# Patient Record
Sex: Female | Born: 1975 | Race: Black or African American | Hispanic: No | Marital: Single | Smoking: Current some day smoker
Health system: Southern US, Community
[De-identification: ages and names within clinical notes are randomized; demographics above are authoritative.]

## PROBLEM LIST (undated history)

## (undated) DIAGNOSIS — I10 Essential (primary) hypertension: Secondary | ICD-10-CM

## (undated) HISTORY — PX: HERNIA REPAIR: SHX51

---

## 2014-11-29 ENCOUNTER — Emergency Department
Admission: EM | Admit: 2014-11-29 | Discharge: 2014-11-29 | Disposition: A | Discharge status: Home / Self Care | Payer: Self-pay | Attending: Emergency Medicine | Admitting: Emergency Medicine

## 2014-11-29 ENCOUNTER — Other Ambulatory Visit: Payer: Self-pay

## 2014-11-29 DIAGNOSIS — R51 Headache: Secondary | ICD-10-CM | POA: Insufficient documentation

## 2014-11-29 DIAGNOSIS — R079 Chest pain, unspecified: Secondary | ICD-10-CM | POA: Insufficient documentation

## 2014-11-29 DIAGNOSIS — R519 Headache, unspecified: Secondary | ICD-10-CM

## 2014-11-29 DIAGNOSIS — Z720 Tobacco use: Secondary | ICD-10-CM | POA: Insufficient documentation

## 2014-11-29 DIAGNOSIS — R110 Nausea: Secondary | ICD-10-CM | POA: Insufficient documentation

## 2014-11-29 LAB — BASIC METABOLIC PANEL
Anion gap: 7 (ref 5–15)
BUN: 9 mg/dL (ref 6–20)
CO2: 25 mmol/L (ref 22–32)
CREATININE: 0.93 mg/dL (ref 0.44–1.00)
Calcium: 8.5 mg/dL — ABNORMAL LOW (ref 8.9–10.3)
Chloride: 104 mmol/L (ref 101–111)
GFR calc non Af Amer: >60 mL/min (ref >60)
Glucose, Bld: 113 mg/dL — ABNORMAL HIGH (ref 65–99)
Potassium: 3.2 mmol/L — ABNORMAL LOW (ref 3.5–5.1)
SODIUM: 136 mmol/L (ref 135–145)

## 2014-11-29 LAB — CBC
HCT: 38.5 % (ref 35.0–47.0)
Hemoglobin: 12.9 g/dL (ref 12.0–16.0)
MCH: 29.6 pg (ref 26.0–34.0)
MCHC: 33.5 g/dL (ref 32.0–36.0)
MCV: 88.4 fL (ref 80.0–100.0)
Platelets: 237 10*3/uL (ref 150–440)
RBC: 4.35 MIL/uL (ref 3.80–5.20)
RDW: 12.9 % (ref 11.5–14.5)
WBC: 8 10*3/uL (ref 3.6–11.0)

## 2014-11-29 LAB — TROPONIN I: Troponin I: <0.03 ng/mL (ref <0.031)

## 2014-11-29 MED ORDER — ZOLPIDEM TARTRATE 10 MG PO TABS: 10.0000 mg | ORAL_TABLET | Freq: Every evening | ORAL | Status: AC | PRN

## 2014-11-29 MED ORDER — SODIUM CHLORIDE 0.9 % IV SOLN
Freq: Once | INTRAVENOUS | Status: AC
  Administered 2014-11-29: 13:00:00 via INTRAVENOUS

## 2014-11-29 MED ORDER — KETOROLAC TROMETHAMINE 30 MG/ML IJ SOLN
INTRAMUSCULAR | Status: AC
  Administered 2014-11-29: 30 mg via INTRAVENOUS
  Filled 2014-11-29: qty 1

## 2014-11-29 MED ORDER — DIPHENHYDRAMINE HCL 50 MG/ML IJ SOLN
INTRAMUSCULAR | Status: AC
  Administered 2014-11-29: 25 mg via INTRAVENOUS
  Filled 2014-11-29: qty 1

## 2014-11-29 MED ORDER — BUTALBITAL-APAP-CAFFEINE 50-325-40 MG PO TABS: 1.0000 | ORAL_TABLET | Freq: Four times a day (QID) | ORAL | Status: AC | PRN

## 2014-11-29 MED ORDER — METOCLOPRAMIDE HCL 5 MG/ML IJ SOLN
10.0000 mg | Freq: Once | INTRAMUSCULAR | Status: AC
  Administered 2014-11-29: 10 mg via INTRAVENOUS

## 2014-11-29 MED ORDER — METOCLOPRAMIDE HCL 5 MG/ML IJ SOLN
INTRAMUSCULAR | Status: AC
  Administered 2014-11-29: 10 mg via INTRAVENOUS
  Filled 2014-11-29: qty 2

## 2014-11-29 MED ORDER — KETOROLAC TROMETHAMINE 30 MG/ML IJ SOLN
30.0000 mg | Freq: Once | INTRAMUSCULAR | Status: AC
  Administered 2014-11-29: 30 mg via INTRAVENOUS

## 2014-11-29 MED ORDER — DIPHENHYDRAMINE HCL 50 MG/ML IJ SOLN
25.0000 mg | Freq: Once | INTRAMUSCULAR | Status: AC
  Administered 2014-11-29: 25 mg via INTRAVENOUS

## 2014-11-29 NOTE — ED Notes (Signed)
POCT urine preg- Negative.  

## 2014-11-29 NOTE — ED Notes (Signed)
"  Migrane" X 2 days, nauseated, intermittent chest "tightness". Pain behind left eye. Denies weakness, dizziness, or AMS. Pt alert and oriented X4, active, cooperative, pt in NAD. RR even and unlabored, color WNL.

## 2014-11-29 NOTE — ED Provider Notes (Signed)
[facility] Emergency Department Provider Note     Time seen: ----------------------------------------- 12:21 PM on 11/29/2014 -----------------------------------------    I have reviewed the triage vital signs and the nursing notes.   HISTORY  Chief Complaint Headache; Chest Pain; and Nausea    HPI Yvonne Daniels is a 39 y.o. female since ER for migraine for the last 2 days. She has not had a history of migraines, this is what she refers to these headaches as. Does not have a history of significant headaches in the past. She notes she is not slept well for the last 2 weeks, has pain in the left frontal scalp area, light and sound bothers her. She is nauseous, was any vomiting. Denies any numbness weakness or focal neurologic complaint. Pain is severe this time.    History reviewed. No pertinent past medical history.  There are no active problems to display for this patient.   Past Surgical History  Procedure Laterality Date  . Hernia repair      No current outpatient prescriptions on file.  Allergies Review of patient's allergies indicates no known allergies.  No family history on file.  Social History History  Substance Use Topics  . Smoking status: Current Some Day Smoker  . Smokeless tobacco: Not on file  . Alcohol Use: No    Review of Systems Constitutional: Negative for fever. Eyes: Negative for visual changes. ENT: Negative for sore throat. Cardiovascular: Negative for chest pain. Respiratory: Negative for shortness of breath. Gastrointestinal: Negative for abdominal pain, positive for nausea Genitourinary: Negative for dysuria. Musculoskeletal: Negative for back pain. Skin: Negative for rash. Neurological: Positive for headaches, negative for focal weakness or numbness.  10-point ROS otherwise negative.  ____________________________________________   PHYSICAL EXAM:  VITAL SIGNS: ED Triage Vitals  Enc Vitals  Group     BP 11/29/14 1100 189/111 mmHg     Pulse Rate 11/29/14 1100 76     Resp 11/29/14 1100 18     Temp 11/29/14 1100 97.8 F (36.6 C)     Temp Source 11/29/14 1100 Oral     SpO2 11/29/14 1100 100 %     Weight 11/29/14 1100 240 lb (108.863 kg)     Height 11/29/14 1100  (1.702 m)     Head Cir --      Peak Flow --      Pain Score 11/29/14 1101 10     Pain Loc --      Pain Edu? --      Excl. in GC? --     Constitutional: Alert and oriented. Well appearing and in no distress. Eyes: Conjunctivae are normal. Mild photophobia Normal extraocular movements. ENT   Head: Normocephalic and atraumatic.   Nose: No congestion/rhinnorhea.   Mouth/Throat: Mucous membranes are moist.   Neck: No stridor. Hematological/Lymphatic/Immunilogical: No cervical lymphadenopathy. Cardiovascular: Normal rate, regular rhythm. Normal and symmetric distal pulses are present in all extremities. No murmurs, rubs, or gallops. Respiratory: Normal respiratory effort without tachypnea nor retractions. Breath sounds are clear and equal bilaterally. No wheezes/rales/rhonchi. Gastrointestinal: Soft and nontender. No distention. No abdominal bruits. There is no CVA tenderness. Musculoskeletal: Nontender with normal range of motion in all extremities. No joint effusions.  No lower extremity tenderness nor edema. Neurologic:  Normal speech and language. No gross focal neurologic deficits are appreciated. Speech is normal. No gait instability. Skin:  Skin is warm, dry and intact. No rash noted. Psychiatric: Mood and affect are normal. Speech and behavior are normal. Patient  exhibits appropriate insight and judgment.  ____________________________________________    LABS (pertinent positives/negatives)  Labs Reviewed  BASIC METABOLIC PANEL - Abnormal; Notable for the following:    Potassium 3.2 (*)    Glucose, Bld 113 (*)    Calcium 8.5 (*)    All other components within normal limits  CBC   TROPONIN I  POC URINE PREG, ED      ____________________________________________  ED COURSE:  Pertinent labs & imaging results that were available during my care of the patient were reviewed by me and considered in my medical decision making (see chart for details).  Patient received IV fluid as well as IV Reglan, Toradol and Benadryl. ____________________________________________   RADIOLOGY  None  ____________________________________________    FINAL ASSESSMENT AND PLAN  Headache  Plan: Patient headaches likely due to sleep deprivation. Patient on better after saline, Reglan Toradol and Benadryl. We'll prescribe a sleep aid. She is stable for outpatient follow-up, no indication for CT scan or further workup at this time.    Emily FilbertWilliams, Jonathan E, MD   Emily FilbertJonathan E Williams, MD 11/29/14 [phone]

## 2014-11-29 NOTE — Discharge Instructions (Signed)
General Headache Without Cause  A general headache is pain or discomfort felt around the head or neck area. The cause may not be found.   HOME CARE   · Keep all doctor visits.  · Only take medicines as told by your doctor.  · Lie down in a dark, quiet room when you have a headache.  · Keep a journal to find out if certain things bring on headaches. For example, write down:  ¨ What you eat and drink.  ¨ How much sleep you get.  ¨ Any change to your diet or medicines.  · Relax by getting a massage or doing other relaxing activities.  · Put ice or heat packs on the head and neck area as told by your doctor.  · Lessen stress.  · Sit up straight. Do not tighten (tense) your muscles.  · Quit smoking if you smoke.  · Lessen how much alcohol you drink.  · Lessen how much caffeine you drink, or stop drinking caffeine.  · Eat and sleep on a regular schedule.  · Get 7 to 9 hours of sleep, or as told by your doctor.  · Keep lights dim if bright lights bother you or make your headaches worse.  GET HELP RIGHT AWAY IF:   · Your headache becomes really bad.  · You have a fever.  · You have a stiff neck.  · You have trouble seeing.  · Your muscles are weak, or you lose muscle control.  · You lose your balance or have trouble walking.  · You feel like you will pass out (faint), or you pass out.  · You have really bad symptoms that are different than your first symptoms.  · You have problems with the medicines given to you by your doctor.  · Your medicines do not work.  · Your headache feels different than the other headaches.  · You feel sick to your stomach (nauseous) or throw up (vomit).  MAKE SURE YOU:   · Understand these instructions.  · Will watch your condition.  · Will get help right away if you are not doing well or get worse.  Document Released: 04/01/2008 Document Revised: 09/15/2011 Document Reviewed: 06/13/2011  ExitCare® Patient Information ©2015 [employer]. This information is not intended to replace advice given to  you by your health care provider. Make sure you discuss any questions you have with your health care provider.

## 2014-11-30 DIAGNOSIS — I10 Essential (primary) hypertension: Secondary | ICD-10-CM | POA: Insufficient documentation

## 2014-11-30 DIAGNOSIS — Z79899 Other long term (current) drug therapy: Secondary | ICD-10-CM | POA: Insufficient documentation

## 2014-11-30 DIAGNOSIS — Z720 Tobacco use: Secondary | ICD-10-CM | POA: Insufficient documentation

## 2014-11-30 DIAGNOSIS — R51 Headache: Secondary | ICD-10-CM | POA: Insufficient documentation

## 2014-11-30 LAB — BASIC METABOLIC PANEL
Anion gap: 7 (ref 5–15)
BUN: 9 mg/dL (ref 6–20)
CO2: 25 mmol/L (ref 22–32)
Calcium: 8.7 mg/dL — ABNORMAL LOW (ref 8.9–10.3)
Chloride: 107 mmol/L (ref 101–111)
Creatinine, Ser: 1.06 mg/dL — ABNORMAL HIGH (ref 0.44–1.00)
GFR calc non Af Amer: >60 mL/min (ref >60)
GLUCOSE: 100 mg/dL — AB (ref 65–99)
Potassium: 3.2 mmol/L — ABNORMAL LOW (ref 3.5–5.1)
Sodium: 139 mmol/L (ref 135–145)

## 2014-11-30 LAB — CBC
HCT: 37.3 % (ref 35.0–47.0)
Hemoglobin: 12.4 g/dL (ref 12.0–16.0)
MCH: 29.4 pg (ref 26.0–34.0)
MCHC: 33.2 g/dL (ref 32.0–36.0)
MCV: 88.6 fL (ref 80.0–100.0)
PLATELETS: 242 10*3/uL (ref 150–440)
RBC: 4.21 MIL/uL (ref 3.80–5.20)
RDW: 12.8 % (ref 11.5–14.5)
WBC: 7.9 10*3/uL (ref 3.6–11.0)

## 2014-11-30 LAB — TROPONIN I

## 2014-11-30 NOTE — ED Notes (Signed)
Pt in with co htn for about a month, seen here yest for migraine and released.  Today still having migraine but now concerned about bp, never has been dx with htn.

## 2014-12-01 ENCOUNTER — Emergency Department
Admission: EM | Admit: 2014-12-01 | Discharge: 2014-12-01 | Disposition: A | Discharge status: Home / Self Care | Payer: Self-pay | Attending: Emergency Medicine | Admitting: Emergency Medicine

## 2014-12-01 DIAGNOSIS — I10 Essential (primary) hypertension: Secondary | ICD-10-CM

## 2014-12-01 MED ORDER — HYDROCHLOROTHIAZIDE 25 MG PO TABS: 25.0000 mg | ORAL_TABLET | Freq: Every day | ORAL | Status: DC

## 2014-12-01 NOTE — Discharge Instructions (Signed)
Hypertension Hypertension, commonly called high blood pressure, is when the force of blood pumping through your arteries is too strong. Your arteries are the blood vessels that carry blood from your heart throughout your body. A blood pressure reading consists of a higher number over a lower number, such as 110/72. The higher number (systolic) is the pressure inside your arteries when your heart pumps. The lower number (diastolic) is the pressure inside your arteries when your heart relaxes. Ideally you want your blood pressure below 120/80. Hypertension forces your heart to work harder to pump blood. Your arteries may become narrow or stiff. Having hypertension puts you at risk for heart disease, stroke, and other problems.  RISK FACTORS Some risk factors for high blood pressure are controllable. Others are not.  Risk factors you cannot control include:   Race. You may be at higher risk if you are African American.  Age. Risk increases with age.  Gender. Men are at higher risk than women before age 45 years. After age 65, women are at higher risk than men. Risk factors you can control include:  Not getting enough exercise or physical activity.  Being overweight.  Getting too much fat, sugar, calories, or salt in your diet.  Drinking too much alcohol. SIGNS AND SYMPTOMS Hypertension does not usually cause signs or symptoms. Extremely high blood pressure (hypertensive crisis) may cause headache, anxiety, shortness of breath, and nosebleed. DIAGNOSIS  To check if you have hypertension, your health care provider will measure your blood pressure while you are seated, with your arm held at the level of your heart. It should be measured at least twice using the same arm. Certain conditions can cause a difference in blood pressure between your right and left arms. A blood pressure reading that is higher than normal on one occasion does not mean that you need treatment. If one blood pressure reading  is high, ask your health care provider about having it checked again. TREATMENT  Treating high blood pressure includes making lifestyle changes and possibly taking medicine. Living a healthy lifestyle can help lower high blood pressure. You may need to change some of your habits. Lifestyle changes may include:  Following the DASH diet. This diet is high in fruits, vegetables, and whole grains. It is low in salt, red meat, and added sugars.  Getting at least 2 hours of brisk physical activity every week.  Losing weight if necessary.  Not smoking.  Limiting alcoholic beverages.  Learning ways to reduce stress. If lifestyle changes are not enough to get your blood pressure under control, your health care provider may prescribe medicine. You may need to take more than one. Work closely with your health care provider to understand the risks and benefits. HOME CARE INSTRUCTIONS  Have your blood pressure rechecked as directed by your health care provider.   Take medicines only as directed by your health care provider. Follow the directions carefully. Blood pressure medicines must be taken as prescribed. The medicine does not work as well when you skip doses. Skipping doses also puts you at risk for problems.   Do not smoke.   Monitor your blood pressure at home as directed by your health care provider. SEEK MEDICAL CARE IF:   You think you are having a reaction to medicines taken.  You have recurrent headaches or feel dizzy.  You have swelling in your ankles.  You have trouble with your vision. SEEK IMMEDIATE MEDICAL CARE IF:  You develop a severe headache or confusion.    You have unusual weakness, numbness, or feel faint.  You have severe chest or abdominal pain.  You vomit repeatedly.  You have trouble breathing. MAKE SURE YOU:   Understand these instructions.  Will watch your condition.  Will get help right away if you are not doing well or get worse. Document  Released: 06/23/2005 Document Revised: 11/07/2013 Document Reviewed: 04/15/2013 ExitCare Patient Information 2015 [employer]. This information is not intended to replace advice given to you by your health care provider. Make sure you discuss any questions you have with your health care provider. DASH Eating Plan DASH stands for "Dietary Approaches to Stop Hypertension." The DASH eating plan is a healthy eating plan that has been shown to reduce high blood pressure (hypertension). Additional health benefits may include reducing the risk of type 2 diabetes mellitus, heart disease, and stroke. The DASH eating plan may also help with weight loss. WHAT DO I NEED TO KNOW ABOUT THE DASH EATING PLAN? For the DASH eating plan, you will follow these general guidelines:  Choose foods with a percent daily value for sodium of less than 5% (as listed on the food label).  Use salt-free seasonings or herbs instead of table salt or sea salt.  Check with your health care provider or pharmacist before using salt substitutes.  Eat lower-sodium products, often labeled as "lower sodium" or "no salt added."  Eat fresh foods.  Eat more vegetables, fruits, and low-fat dairy products.  Choose whole grains. Look for the word "whole" as the first word in the ingredient list.  Choose fish and skinless chicken or turkey more often than red meat. Limit fish, poultry, and meat to 6 oz (170 g) each day.  Limit sweets, desserts, sugars, and sugary drinks.  Choose heart-healthy fats.  Limit cheese to 1 oz (28 g) per day.  Eat more home-cooked food and less restaurant, buffet, and fast food.  Limit fried foods.  Cook foods using methods other than frying.  Limit canned vegetables. If you do use them, rinse them well to decrease the sodium.  When eating at a restaurant, ask that your food be prepared with less salt, or no salt if possible. WHAT FOODS CAN I EAT? Seek help from a dietitian for individual  calorie needs. Grains Whole grain or whole wheat bread. Brown rice. Whole grain or whole wheat pasta. Quinoa, bulgur, and whole grain cereals. Low-sodium cereals. Corn or whole wheat flour tortillas. Whole grain cornbread. Whole grain crackers. Low-sodium crackers. Vegetables Fresh or frozen vegetables (raw, steamed, roasted, or grilled). Low-sodium or reduced-sodium tomato and vegetable juices. Low-sodium or reduced-sodium tomato sauce and paste. Low-sodium or reduced-sodium canned vegetables.  Fruits All fresh, canned (in natural juice), or frozen fruits. Meat and Other Protein Products Ground beef (85% or leaner), grass-fed beef, or beef trimmed of fat. Skinless chicken or turkey. Ground chicken or turkey. Pork trimmed of fat. All fish and seafood. Eggs. Dried beans, peas, or lentils. Unsalted nuts and seeds. Unsalted canned beans. Dairy Low-fat dairy products, such as skim or 1% milk, 2% or reduced-fat cheeses, low-fat ricotta or cottage cheese, or plain low-fat yogurt. Low-sodium or reduced-sodium cheeses. Fats and Oils Tub margarines without trans fats. Light or reduced-fat mayonnaise and salad dressings (reduced sodium). Avocado. Safflower, olive, or canola oils. Natural peanut or almond butter. Other Unsalted popcorn and pretzels. The items listed above may not be a complete list of recommended foods or beverages. Contact your dietitian for more options. WHAT FOODS ARE NOT RECOMMENDED? Grains White bread.   White pasta. White rice. Refined cornbread. Bagels and croissants. Crackers that contain trans fat. Vegetables Creamed or fried vegetables. Vegetables in a cheese sauce. Regular canned vegetables. Regular canned tomato sauce and paste. Regular tomato and vegetable juices. Fruits Dried fruits. Canned fruit in light or heavy syrup. Fruit juice. Meat and Other Protein Products Fatty cuts of meat. Ribs, chicken wings, bacon, sausage, bologna, salami, chitterlings, fatback, hot dogs,  bratwurst, and packaged luncheon meats. Salted nuts and seeds. Canned beans with salt. Dairy Whole or 2% milk, cream, half-and-half, and cream cheese. Whole-fat or sweetened yogurt. Full-fat cheeses or blue cheese. Nondairy creamers and whipped toppings. Processed cheese, cheese spreads, or cheese curds. Condiments Onion and garlic salt, seasoned salt, table salt, and sea salt. Canned and packaged gravies. Worcestershire sauce. Tartar sauce. Barbecue sauce. Teriyaki sauce. Soy sauce, including reduced sodium. Steak sauce. Fish sauce. Oyster sauce. Cocktail sauce. Horseradish. Ketchup and mustard. Meat flavorings and tenderizers. Bouillon cubes. Hot sauce. Tabasco sauce. Marinades. Taco seasonings. Relishes. Fats and Oils Butter, stick margarine, lard, shortening, ghee, and bacon fat. Coconut, palm kernel, or palm oils. Regular salad dressings. Other Pickles and olives. Salted popcorn and pretzels. The items listed above may not be a complete list of foods and beverages to avoid. Contact your dietitian for more information. WHERE CAN I FIND MORE INFORMATION? National Heart, Lung, and Blood Institute: www.nhlbi.nih.gov/health/health-topics/topics/dash/ Document Released: 06/12/2011 Document Revised: 11/07/2013 Document Reviewed: 04/27/2013 ExitCare Patient Information 2015 [employer]. This information is not intended to replace advice given to you by your health care provider. Make sure you discuss any questions you have with your health care provider.  

## 2014-12-01 NOTE — ED Provider Notes (Signed)
[facility] Emergency Department Provider Note  ____________________________________________  Time seen: Approximately 12:31 AM  I have reviewed the triage vital signs and the nursing notes.   HISTORY  Chief Complaint Hypertension    HPI Yvonne Daniels is a 39 y.o. female who presents with a chief complaint of hypertension. 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 Patient was seen in the ED yesterday for migraine; hypertension was noted on the visit yesterday. Today patient says headache is much improved. Denies blurred vision, chest pain, shortness of breath, vomiting, diarrhea, weakness, numbness, tingling.  Past medical history None   Past Surgical History  Procedure Laterality Date  . Hernia repair      Current Outpatient Rx  Name  Route  Sig  Dispense  Refill  . butalbital-acetaminophen-caffeine (FIORICET) 50-325-40 MG per tablet   Oral   Take 1-2 tablets by mouth every 6 (six) hours as needed for headache.   20 tablet   0   . zolpidem (AMBIEN) 10 MG tablet   Oral   Take 1 tablet (10 mg total) by mouth at bedtime as needed for sleep.   30 tablet   0   . hydrochlorothiazide (HYDRODIURIL) 25 MG tablet   Oral   Take 1 tablet (25 mg total) by mouth daily.   30 tablet   0     Allergies Review of patient's allergies indicates no known allergies.  Family history Hypertension on both sides of her family.  Social History History  Substance Use Topics  . Smoking status: Current Some Day Smoker  . Smokeless tobacco: Not on file  . Alcohol Use: No    Review of Systems Constitutional: No fever/chills Eyes: No visual changes. ENT: No sore throat. Cardiovascular: Denies chest pain. Respiratory: Denies shortness of breath. Gastrointestinal: No abdominal pain.  No nausea, no vomiting.  No diarrhea.  No constipation. Genitourinary: Negative for  dysuria. Musculoskeletal: Negative for back pain. Skin: Negative for rash. Neurological: Positive for headaches. Negative for focal weakness or numbness.  10-point ROS otherwise negative.  ____________________________________________   PHYSICAL EXAM:  VITAL SIGNS: ED Triage Vitals  Enc Vitals Group     BP 11/30/14 1944 184/117 mmHg     Pulse Rate 11/30/14 1944 74     Resp 11/30/14 1944 18     Temp 11/30/14 1944 98.2 F (36.8 C)     Temp Source 11/30/14 1944 Oral     SpO2 11/30/14 1944 100 %     Weight 11/30/14 1944 240 lb (108.863 kg)     Height 11/30/14 1944 5\' 7"  (1.702 m)     Head Cir --      Peak Flow --      Pain Score 11/30/14 1944 4     Pain Loc --      Pain Edu? --      Excl. in GC? --     Constitutional: Alert and oriented. Well appearing and in no acute distress. Eyes: Conjunctivae are normal. PERRL. EOMI. Head: Atraumatic. Nose: No congestion/rhinnorhea. Mouth/Throat: Mucous membranes are moist.  Oropharynx non-erythematous. Neck: No stridor. No carotid bruits. Cardiovascular: Normal rate, regular rhythm. Grossly normal heart sounds.  Good peripheral circulation. Respiratory: Normal respiratory effort.  No retractions. Lungs CTAB. Gastrointestinal: Soft and nontender. No distention. No abdominal bruits. No CVA tenderness. Musculoskeletal: No lower extremity tenderness nor edema.  No joint effusions. Neurologic:  Normal speech and language. No gross focal  neurologic deficits are appreciated. Speech is normal. No gait instability. Skin:  Skin is warm, dry and intact. No rash noted. Psychiatric: Mood and affect are normal. Speech and behavior are normal.  ____________________________________________   LABS (all labs ordered are listed, but only abnormal results are displayed)  Labs Reviewed  BASIC METABOLIC PANEL - Abnormal; Notable for the following:    Potassium 3.2 (*)    Glucose, Bld 100 (*)    Creatinine, Ser 1.06 (*)    Calcium 8.7 (*)    All  other components within normal limits  CBC  TROPONIN I   ____________________________________________  EKG  ED ECG REPORT I, Johanny Segers J, the attending physician, personally viewed and interpreted this ECG.   Date: 12/01/2014  EKG Time: 2006  Rate: 61  Rhythm: normal EKG, normal sinus rhythm  Axis: Normal  Intervals:none  ST&T Change: Nonspecific  ____________________________________________  RADIOLOGY  None ____________________________________________   PROCEDURES  Procedure(s) performed: None  Critical Care performed: No  ____________________________________________   INITIAL IMPRESSION / ASSESSMENT AND PLAN / ED COURSE  Pertinent labs & imaging results that were available during my care of the patient were reviewed by me and considered in my medical decision making (see chart for details).  39 year old African-American female with hypertension; significant family history of hypertension as well. No evidence of end organ damage on labs or EKG. Discussed with patient-will start patient on HCTZ 25 mg daily and establish PCP for follow-up care. Strict return precautions given. Patient and family verbalized understanding and agrees with plan of care. ____________________________________________   FINAL CLINICAL IMPRESSION(S) / ED DIAGNOSES  Final diagnoses:  Essential hypertension      Irean Hong, MD 12/01/14 [phone]

## 2015-07-04 ENCOUNTER — Emergency Department
Admission: EM | Admit: 2015-07-04 | Discharge: 2015-07-04 | Disposition: A | Discharge status: Home / Self Care | Payer: Self-pay | Attending: Emergency Medicine | Admitting: Emergency Medicine

## 2015-07-04 ENCOUNTER — Encounter: Payer: Self-pay | Admitting: *Deleted

## 2015-07-04 ENCOUNTER — Emergency Department: Payer: Self-pay

## 2015-07-04 ENCOUNTER — Other Ambulatory Visit: Payer: Self-pay

## 2015-07-04 DIAGNOSIS — J069 Acute upper respiratory infection, unspecified: Secondary | ICD-10-CM | POA: Insufficient documentation

## 2015-07-04 DIAGNOSIS — R197 Diarrhea, unspecified: Secondary | ICD-10-CM | POA: Insufficient documentation

## 2015-07-04 DIAGNOSIS — Z79899 Other long term (current) drug therapy: Secondary | ICD-10-CM | POA: Insufficient documentation

## 2015-07-04 DIAGNOSIS — R112 Nausea with vomiting, unspecified: Secondary | ICD-10-CM | POA: Insufficient documentation

## 2015-07-04 DIAGNOSIS — F17200 Nicotine dependence, unspecified, uncomplicated: Secondary | ICD-10-CM | POA: Insufficient documentation

## 2015-07-04 DIAGNOSIS — R109 Unspecified abdominal pain: Secondary | ICD-10-CM | POA: Insufficient documentation

## 2015-07-04 LAB — URINALYSIS COMPLETE WITH MICROSCOPIC (ARMC ONLY)
BILIRUBIN URINE: NEGATIVE
Glucose, UA: NEGATIVE mg/dL
KETONES UR: NEGATIVE mg/dL
Leukocytes, UA: NEGATIVE
NITRITE: NEGATIVE
PH: 5 (ref 5.0–8.0)
PROTEIN: NEGATIVE mg/dL
Specific Gravity, Urine: 1.023 (ref 1.005–1.030)

## 2015-07-04 LAB — COMPREHENSIVE METABOLIC PANEL
ALK PHOS: 46 U/L (ref 38–126)
ALT: 15 U/L (ref 14–54)
AST: 9 U/L — ABNORMAL LOW (ref 15–41)
Albumin: 3.6 g/dL (ref 3.5–5.0)
Anion gap: 5 (ref 5–15)
BUN: 11 mg/dL (ref 6–20)
CALCIUM: 8.6 mg/dL — AB (ref 8.9–10.3)
CO2: 25 mmol/L (ref 22–32)
Chloride: 108 mmol/L (ref 101–111)
Creatinine, Ser: 0.95 mg/dL (ref 0.44–1.00)
GFR calc non Af Amer: >60 mL/min (ref >60)
Glucose, Bld: 85 mg/dL (ref 65–99)
Potassium: 4.2 mmol/L (ref 3.5–5.1)
Sodium: 138 mmol/L (ref 135–145)
Total Bilirubin: 0.5 mg/dL (ref 0.3–1.2)
Total Protein: 6.9 g/dL (ref 6.5–8.1)

## 2015-07-04 LAB — CBC
HCT: 36.6 % (ref 35.0–47.0)
HEMOGLOBIN: 12 g/dL (ref 12.0–16.0)
MCH: 28.9 pg (ref 26.0–34.0)
MCHC: 32.7 g/dL (ref 32.0–36.0)
MCV: 88.3 fL (ref 80.0–100.0)
Platelets: 245 10*3/uL (ref 150–440)
RBC: 4.14 MIL/uL (ref 3.80–5.20)
RDW: 13.2 % (ref 11.5–14.5)
WBC: 12.7 10*3/uL — ABNORMAL HIGH (ref 3.6–11.0)

## 2015-07-04 MED ORDER — TRAMADOL HCL 50 MG PO TABS: 50.0000 mg | ORAL_TABLET | Freq: Four times a day (QID) | ORAL | Status: AC | PRN

## 2015-07-04 MED ORDER — ONDANSETRON HCL 4 MG/2ML IJ SOLN
4.0000 mg | Freq: Once | INTRAMUSCULAR | Status: AC
  Administered 2015-07-04: 4 mg via INTRAVENOUS
  Filled 2015-07-04: qty 2

## 2015-07-04 MED ORDER — MORPHINE SULFATE (PF) 4 MG/ML IV SOLN
4.0000 mg | Freq: Once | INTRAVENOUS | Status: AC
  Administered 2015-07-04: 4 mg via INTRAVENOUS
  Filled 2015-07-04: qty 1

## 2015-07-04 MED ORDER — SODIUM CHLORIDE 0.9 % IV BOLUS (SEPSIS)
1000.0000 mL | Freq: Once | INTRAVENOUS | Status: AC
  Administered 2015-07-04: 1000 mL via INTRAVENOUS

## 2015-07-04 MED ORDER — ONDANSETRON 4 MG PO TBDP: 4.0000 mg | ORAL_TABLET | Freq: Three times a day (TID) | ORAL | Status: DC | PRN

## 2015-07-04 NOTE — Discharge Instructions (Signed)
Nausea and Vomiting °Nausea means you feel sick to your stomach. Throwing up (vomiting) is a reflex where stomach contents come out of your mouth. °HOME CARE  °· Take medicine as told by your doctor. °· Do not force yourself to eat. However, you do need to drink fluids. °· If you feel like eating, eat a normal diet as told by your doctor. °· Eat rice, wheat, potatoes, bread, lean meats, yogurt, fruits, and vegetables. °· Avoid high-fat foods. °· Drink enough fluids to keep your pee (urine) clear or pale yellow. °· Ask your doctor how to replace body fluid losses (rehydrate). Signs of body fluid loss (dehydration) include: °· Feeling very thirsty. °· Dry lips and mouth. °· Feeling dizzy. °· Dark pee. °· Peeing less than normal. °· Feeling confused. °· Fast breathing or heart rate. °GET HELP RIGHT AWAY IF:  °· You have blood in your throw up. °· You have black or bloody poop (stool). °· You have a bad headache or stiff neck. °· You feel confused. °· You have bad belly (abdominal) pain. °· You have chest pain or trouble breathing. °· You do not pee at least once every 8 hours. °· You have cold, clammy skin. °· You keep throwing up after 24 to 48 hours. °· You have a fever. °MAKE SURE YOU:  °· Understand these instructions. °· Will watch your condition. °· Will get help right away if you are not doing well or get worse. °  °This information is not intended to replace advice given to you by your health care provider. Make sure you discuss any questions you have with your health care provider. °  °Document Released: 12/10/2007 Document Revised: 09/15/2011 Document Reviewed: 11/22/2010 °Elsevier Interactive Patient Education ©2016 [employer] ° °Upper Respiratory Infection, Adult °Most upper respiratory infections (URIs) are a viral infection of the air passages leading to the lungs. A URI affects the nose, throat, and upper air passages. The most common type of URI is nasopharyngitis and is typically referred to as "the  common cold." °URIs run their course and usually go away on their own. Most of the time, a URI does not require medical attention, but sometimes a bacterial infection in the upper airways can follow a viral infection. This is called a secondary infection. Sinus and middle ear infections are common types of secondary upper respiratory infections. °Bacterial pneumonia can also complicate a URI. A URI can worsen asthma and chronic obstructive pulmonary disease (COPD). Sometimes, these complications can require emergency medical care and may be life threatening.  °CAUSES °Almost all URIs are caused by viruses. A virus is a type of germ and can spread from one person to another.  °RISKS FACTORS °You may be at risk for a URI if:  °· You smoke.   °· You have chronic heart or lung disease. °· You have a weakened defense (immune) system.   °· You are very young or very old.   °· You have nasal allergies or asthma. °· You work in crowded or poorly ventilated areas. °· You work in health care facilities or schools. °SIGNS AND SYMPTOMS  °Symptoms typically develop 2-3 days after you come in contact with a cold virus. Most viral URIs last 7-10 days. However, viral URIs from the influenza virus (flu virus) can last 14-18 days and are typically more severe. Symptoms may include:  °· Runny or stuffy (congested) nose.   °· Sneezing.   °· Cough.   °· Sore throat.   °· Headache.   °· Fatigue.   °· Fever.   °·   Loss of appetite.   °· Pain in your forehead, behind your eyes, and over your cheekbones (sinus pain). °· Muscle aches.   °DIAGNOSIS  °Your health care provider may diagnose a URI by: °· Physical exam. °· Tests to check that your symptoms are not due to another condition such as: °¨ Strep throat. °¨ Sinusitis. °¨ Pneumonia. °¨ Asthma. °TREATMENT  °A URI goes away on its own with time. It cannot be cured with medicines, but medicines may be prescribed or recommended to relieve symptoms. Medicines may help: °· Reduce your  fever. °· Reduce your cough. °· Relieve nasal congestion. °HOME CARE INSTRUCTIONS  °· Take medicines only as directed by your health care provider.   °· Gargle warm saltwater or take cough drops to comfort your throat as directed by your health care provider. °· Use a warm mist humidifier or inhale steam from a shower to increase air moisture. This may make it easier to breathe. °· Drink enough fluid to keep your urine clear or pale yellow.   °· Eat soups and other clear broths and maintain good nutrition.   °· Rest as needed.   °· Return to work when your temperature has returned to normal or as your health care provider advises. You may need to stay home longer to avoid infecting others. You can also use a face mask and careful hand washing to prevent spread of the virus. °· Increase the usage of your inhaler if you have asthma.   °· Do not use any tobacco products, including cigarettes, chewing tobacco, or electronic cigarettes. If you need help quitting, ask your health care provider. °PREVENTION  °The best way to protect yourself from getting a cold is to practice good hygiene.  °· Avoid oral or hand contact with people with cold symptoms.   °· Wash your hands often if contact occurs.   °There is no clear evidence that vitamin C, vitamin E, echinacea, or exercise reduces the chance of developing a cold. However, it is always recommended to get plenty of rest, exercise, and practice good nutrition.  °SEEK MEDICAL CARE IF:  °· You are getting worse rather than better.   °· Your symptoms are not controlled by medicine.   °· You have chills. °· You have worsening shortness of breath. °· You have brown or red mucus. °· You have yellow or brown nasal discharge. °· You have pain in your face, especially when you bend forward. °· You have a fever. °· You have swollen neck glands. °· You have pain while swallowing. °· You have white areas in the back of your throat. °SEEK IMMEDIATE MEDICAL CARE IF:  °· You have severe  or persistent: °¨ Headache. °¨ Ear pain. °¨ Sinus pain. °¨ Chest pain. °· You have chronic lung disease and any of the following: °¨ Wheezing. °¨ Prolonged cough. °¨ Coughing up blood. °¨ A change in your usual mucus. °· You have a stiff neck. °· You have changes in your: °¨ Vision. °¨ Hearing. °¨ Thinking. °¨ Mood. °MAKE SURE YOU:  °· Understand these instructions. °· Will watch your condition. °· Will get help right away if you are not doing well or get worse. °  °This information is not intended to replace advice given to you by your health care provider. Make sure you discuss any questions you have with your health care provider. °  °Document Released: 12/17/2000 Document Revised: 11/07/2014 Document Reviewed: 09/28/2013 °Elsevier Interactive Patient Education ©2016 [employer] ° °

## 2015-07-04 NOTE — ED Notes (Addendum)
Pt hooked up to cardiac monitor by this tech, pt given call light for assistance if needed. 

## 2015-07-04 NOTE — ED Notes (Addendum)
Pt from home with coughing and congestion since Sunday. States she started having chest pain last night, with sweating as well as n/v/d. States she is unable to keep anything down, including fluids.

## 2015-07-04 NOTE — ED Notes (Signed)
Pt reports cold symptoms started Sunday night, pt reports chest pain started last night

## 2015-07-04 NOTE — ED Notes (Signed)
Patient transported to X-ray 

## 2015-07-04 NOTE — ED Provider Notes (Signed)
[facility] Emergency Department Provider Note  Time seen: 9:42 AM  I have reviewed the triage vital signs and the nursing notes.   HISTORY  Chief Complaint Chest Pain and URI    HPI Yvonne Daniels is a 39 y.o. female with no past medical history who presents the emergency department with cough and congestion for the past 3 days, now with nausea, vomiting, diarrhea and diffuse abdominal cramping per patient. States she cannot keep any fluids down due to the nausea. Also having very watery diarrhea. Denies any black or bloody stool. Denies any dysuria. States diffuse abdominal cramping but denies any focal pain. Cough and congestion, states occasional chills but denies fever. Describes her cramping as moderate. Describes her cough as moderate, mild chest pain with cough only.     History reviewed. No pertinent past medical history.  There are no active problems to display for this patient.   Past Surgical History  Procedure Laterality Date  . Hernia repair      Current Outpatient Rx  Name  Route  Sig  Dispense  Refill  . butalbital-acetaminophen-caffeine (FIORICET) 50-325-40 MG per tablet   Oral   Take 1-2 tablets by mouth every 6 (six) hours as needed for headache.   20 tablet   0   . hydrochlorothiazide (HYDRODIURIL) 25 MG tablet   Oral   Take 1 tablet (25 mg total) by mouth daily.   30 tablet   0   . zolpidem (AMBIEN) 10 MG tablet   Oral   Take 1 tablet (10 mg total) by mouth at bedtime as needed for sleep.   30 tablet   0     Allergies Review of patient's allergies indicates no known allergies.  No family history on file.  Social History Social History  Substance Use Topics  . Smoking status: Current Some Day Smoker  . Smokeless tobacco: None  . Alcohol Use: No    Review of Systems Constitutional: Negative for fever. ENT: Positive for congestion Cardiovascular: Negative for chest pain. Respiratory: Negative for  shortness of breath. Positive for cough  Gastrointestinal: Diffuse abdominal cramping, positive for nausea or vomiting and diarrhea. Genitourinary: Negative for dysuria. Neurological: Negative for headache 10-point ROS otherwise negative.  ____________________________________________   PHYSICAL EXAM:  VITAL SIGNS: ED Triage Vitals  Enc Vitals Group     BP 07/04/15 0903 149/89 mmHg     Pulse Rate 07/04/15 0903 80     Resp 07/04/15 0903 18     Temp 07/04/15 0903 98.1 F (36.7 C)     Temp Source 07/04/15 0903 Oral     SpO2 07/04/15 0903 98 %     Weight 07/04/15 0903 215 lb (97.523 kg)     Height 07/04/15 0903  (1.702 m)     Head Cir --      Peak Flow --      Pain Score 07/04/15 0910 7     Pain Loc --      Pain Edu? --      Excl. in GC? --     Constitutional: Alert and oriented. Well appearing and in no distress. Eyes: Normal exam ENT   Head: Normocephalic and atraumatic.   Mouth/Throat: Mucous membranes are moist. Cardiovascular: Normal rate, regular rhythm. No murmur Respiratory: Normal respiratory effort without tachypnea nor retractions. Breath sounds are clear and equal bilaterally. No wheezes/rales/rhonchi. Gastrointestinal: Minimal diffuse abdominal tenderness, no rebound or guarding. No distention. Musculoskeletal: Nontender with normal range of motion in all  extremities.  Neurologic:  Normal speech and language. No gross focal neurologic deficits Skin:  Skin is warm, dry and intact.  Psychiatric: Mood and affect are normal. Speech and behavior are normal.   ____________________________________________    EKG  EKG reviewed and interpreted by myself shows normal sinus rhythm at 70 bpm, narrow QRS, normal axis, normal intervals, no CT changes. Normal EKG. ____________________________________________    RADIOLOGY  Chest x-ray shows no acute abnormality  ____________________________________________   INITIAL IMPRESSION / ASSESSMENT AND PLAN / ED  COURSE  Pertinent labs & imaging results that were available during my care of the patient were reviewed by me and considered in my medical decision making (see chart for details).  A she presents with chest discomfort when coughing, cough and congestion 3 days, nausea or vomiting and diarrhea. We'll check labs, IV hydrate, treat pain and nausea and obtain a chest x-ray. Overall the patient appears quite well on examination. No focal abdominal tenderness palpation.  Labs are within normal limits. X-ray within normal limits. We'll discharge patient home with Zofran and a short course of pain medication. Patient is agreeable to plan.  ____________________________________________   FINAL CLINICAL IMPRESSION(S) / ED DIAGNOSES  Upper respiratory infection Nausea, vomiting, diarrhea   Minna AntisKevin Demarious Kapur, MD 07/04/15 1136

## 2015-07-04 NOTE — ED Notes (Signed)
Pt given urine cup by this tech, pt advised of urine specimen needed.

## 2016-05-10 ENCOUNTER — Emergency Department (HOSPITAL_COMMUNITY): Payer: No Typology Code available for payment source

## 2016-05-10 ENCOUNTER — Emergency Department (HOSPITAL_COMMUNITY)
Admission: EM | Admit: 2016-05-10 | Discharge: 2016-05-10 | Disposition: A | Discharge status: Home / Self Care | Payer: No Typology Code available for payment source | Attending: Emergency Medicine | Admitting: Emergency Medicine

## 2016-05-10 ENCOUNTER — Encounter (HOSPITAL_COMMUNITY): Payer: Self-pay

## 2016-05-10 DIAGNOSIS — Y939 Activity, unspecified: Secondary | ICD-10-CM | POA: Insufficient documentation

## 2016-05-10 DIAGNOSIS — S199XXA Unspecified injury of neck, initial encounter: Secondary | ICD-10-CM | POA: Diagnosis present

## 2016-05-10 DIAGNOSIS — M25522 Pain in left elbow: Secondary | ICD-10-CM | POA: Diagnosis not present

## 2016-05-10 DIAGNOSIS — M25539 Pain in unspecified wrist: Secondary | ICD-10-CM

## 2016-05-10 DIAGNOSIS — M545 Low back pain, unspecified: Secondary | ICD-10-CM

## 2016-05-10 DIAGNOSIS — Y92410 Unspecified street and highway as the place of occurrence of the external cause: Secondary | ICD-10-CM | POA: Insufficient documentation

## 2016-05-10 DIAGNOSIS — F17200 Nicotine dependence, unspecified, uncomplicated: Secondary | ICD-10-CM | POA: Diagnosis not present

## 2016-05-10 DIAGNOSIS — S161XXA Strain of muscle, fascia and tendon at neck level, initial encounter: Secondary | ICD-10-CM | POA: Diagnosis not present

## 2016-05-10 DIAGNOSIS — R519 Headache, unspecified: Secondary | ICD-10-CM

## 2016-05-10 DIAGNOSIS — M25531 Pain in right wrist: Secondary | ICD-10-CM | POA: Insufficient documentation

## 2016-05-10 DIAGNOSIS — Y999 Unspecified external cause status: Secondary | ICD-10-CM | POA: Insufficient documentation

## 2016-05-10 DIAGNOSIS — R51 Headache: Secondary | ICD-10-CM | POA: Diagnosis not present

## 2016-05-10 MED ORDER — HYDROCHLOROTHIAZIDE 25 MG PO TABS: 25.0000 mg | ORAL_TABLET | Freq: Every day | ORAL | 0 refills | Status: AC

## 2016-05-10 MED ORDER — CYCLOBENZAPRINE HCL 10 MG PO TABS: 10.0000 mg | ORAL_TABLET | Freq: Two times a day (BID) | ORAL | 0 refills | Status: DC | PRN

## 2016-05-10 MED ORDER — CYCLOBENZAPRINE HCL 10 MG PO TABS: 10.0000 mg | ORAL_TABLET | Freq: Two times a day (BID) | ORAL | 0 refills | Status: AC | PRN

## 2016-05-10 MED ORDER — NAPROXEN 375 MG PO TABS: 375.0000 mg | ORAL_TABLET | Freq: Two times a day (BID) | ORAL | 0 refills | Status: DC

## 2016-05-10 MED ORDER — HYDROCODONE-ACETAMINOPHEN 5-325 MG PO TABS
1.0000 | ORAL_TABLET | Freq: Once | ORAL | Status: AC
  Administered 2016-05-10: 1 via ORAL
  Filled 2016-05-10: qty 1

## 2016-05-10 NOTE — ED Notes (Signed)
Ortho tech notified of wrist splint

## 2016-05-10 NOTE — ED Notes (Signed)
Bed: WTR8 Expected date:  Expected time:  Means of arrival:  Comments: 
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
## 2016-05-10 NOTE — Discharge Instructions (Signed)
All other imaging has been normal today. Please take the Flexeril as needed for muscle spasms. Do not drive while taking the flexaril it will make you drowsy. You may take tylenol for the pain. Get plenty of rest. Use a heating pad for muscle aches. Apply ice to wrist for swelling. You are going to have muscle aches but please return to the ED if your symptoms worsen including worsening headache, worsening vision changes, chest pain, shortness of breath, worsening abdominal pain, vomiting. Follow up with her primary care doctor if your symptoms do not improve in the next 5-7 days. I have given you a Physicist, medicalfinancial resource guide for primary care follow-up.

## 2016-05-10 NOTE — ED Notes (Signed)
Unable to complete vitals assessment d/t pt off floor for diagnostic scans. ENM

## 2017-03-25 ENCOUNTER — Encounter: Payer: Self-pay | Admitting: Emergency Medicine

## 2017-03-25 ENCOUNTER — Emergency Department
Admission: EM | Admit: 2017-03-25 | Discharge: 2017-03-25 | Disposition: A | Discharge status: Home / Self Care | Payer: BLUE CROSS/BLUE SHIELD | Attending: Emergency Medicine | Admitting: Emergency Medicine

## 2017-03-25 ENCOUNTER — Emergency Department: Payer: BLUE CROSS/BLUE SHIELD

## 2017-03-25 DIAGNOSIS — J0100 Acute maxillary sinusitis, unspecified: Secondary | ICD-10-CM | POA: Diagnosis not present

## 2017-03-25 DIAGNOSIS — Z79899 Other long term (current) drug therapy: Secondary | ICD-10-CM | POA: Diagnosis not present

## 2017-03-25 DIAGNOSIS — R05 Cough: Secondary | ICD-10-CM | POA: Diagnosis present

## 2017-03-25 DIAGNOSIS — J209 Acute bronchitis, unspecified: Secondary | ICD-10-CM | POA: Diagnosis not present

## 2017-03-25 DIAGNOSIS — F17200 Nicotine dependence, unspecified, uncomplicated: Secondary | ICD-10-CM | POA: Insufficient documentation

## 2017-03-25 MED ORDER — AZITHROMYCIN 500 MG PO TABS: 500.0000 mg | ORAL_TABLET | Freq: Every day | ORAL | 0 refills | Status: AC

## 2017-03-25 MED ORDER — AZITHROMYCIN 500 MG PO TABS
500.0000 mg | ORAL_TABLET | Freq: Once | ORAL | Status: AC
  Administered 2017-03-25: 500 mg via ORAL
  Filled 2017-03-25: qty 1

## 2017-03-25 MED ORDER — BENZONATATE 100 MG PO CAPS
100.0000 mg | ORAL_CAPSULE | Freq: Once | ORAL | Status: AC
  Administered 2017-03-25: 100 mg via ORAL
  Filled 2017-03-25: qty 1

## 2017-03-25 MED ORDER — HYDROCOD POLST-CPM POLST ER 10-8 MG/5ML PO SUER: 5.0000 mL | Freq: Two times a day (BID) | ORAL | 0 refills | Status: AC | PRN

## 2017-03-25 NOTE — ED Provider Notes (Signed)
[facility] Emergency Department Provider Note   First MD Initiated Contact with Patient 03/25/17 [phone]     (approximate)  I have reviewed the triage vital signs and the nursing notes.   HISTORY  Chief Complaint Cough and Nasal Congestion    HPI Yvonne Daniels is a 41 y.o. female presents with 1 day history of nasal congestion and productive cough with yellow sputum. Patient admits to chills however no fever afebrile on presentation temp 97.6.  History reviewed. No pertinent past medical history.  There are no active problems to display for this patient.   Past Surgical History:  Procedure Laterality Date  . HERNIA REPAIR      Prior to Admission medications   Medication Sig Start Date End Date Taking? Authorizing Provider  azithromycin (ZITHROMAX) 500 MG tablet Take 1 tablet (500 mg total) by mouth daily. Take 1 tablet daily for 3 days. 03/25/17 03/28/17  Darci Current, MD  chlorpheniramine-HYDROcodone [facility] PENNKINETIC ER) 10-8 MG/5ML SUER Take 5 mLs by mouth every 12 (twelve) hours as needed. 03/25/17   Darci Current, MD  cyclobenzaprine (FLEXERIL) 10 MG tablet Take 1 tablet (10 mg total) by mouth 2 (two) times daily as needed for muscle spasms. 05/10/16   Rise Mu, PA-C  hydrochlorothiazide (HYDRODIURIL) 25 MG tablet Take 1 tablet (25 mg total) by mouth daily. 05/10/16   Rise Mu, PA-C  Multiple Vitamin (MULTIVITAMIN) tablet Take 1 tablet by mouth daily.    [provider]  ondansetron (ZOFRAN ODT) 4 MG disintegrating tablet Take 1 tablet (4 mg total) by mouth every 8 (eight) hours as needed for nausea or vomiting. Patient not taking: Reported on 05/10/2016 07/04/15   Minna Antis, MD  UNKNOWN TO PATIENT Take 1 tablet by mouth daily. Birth Control. PT cannot tell me the name.    [provider]  zolpidem (AMBIEN) 10 MG tablet Take 1 tablet (10 mg total) by mouth at bedtime as needed for  sleep. Patient not taking: Reported on 05/10/2016 11/29/14   Emily Filbert, MD    Allergies known drug allergies No family history on file.  Social History Social History  Substance Use Topics  . Smoking status: Current Some Day Smoker  . Smokeless tobacco: Never Used  . Alcohol use No    Review of Systems Constitutional: No fever/chills Eyes: No visual changes. ENT: No sore throat.positive for nasal congestion Cardiovascular: Denies chest pain. Respiratory: Denies shortness of breath.positive for cough Gastrointestinal: No abdominal pain.  No nausea, no vomiting.  No diarrhea.  No constipation. Genitourinary: Negative for dysuria. Musculoskeletal: Negative for neck pain.  Negative for back pain. Integumentary: Negative for rash. Neurological: Negative for headaches, focal weakness or numbness.  ____________________________________________   PHYSICAL EXAM:  VITAL SIGNS: ED Triage Vitals [03/25/17 0507]  Enc Vitals Group     BP (!) 167/88     Pulse Rate 76     Resp 20     Temp 97.6 F (36.4 C)     Temp Source Oral     SpO2 98 %     Weight 108.9 kg (240 lb)     Height 1.702 m ( )     Head Circumference      Peak Flow      Pain Score      Pain Loc      Pain Edu?      Excl. in GC?     Constitutional: Alert and oriented. Well appearing and in  no acute distress. Eyes: Conjunctivae are normal.  Head: Atraumatic. Nose: No congestion/rhinnorhea. Mouth/Throat: Mucous membranes are moist. Oropharynx non-erythematous. Neck: No stridor.  Cardiovascular: Normal rate, regular rhythm. Good peripheral circulation. Grossly normal heart sounds. Respiratory: Normal respiratory effort.  No retractions. Lungs CTAB. Gastrointestinal: Soft and nontender. No distention.  Musculoskeletal: No lower extremity tenderness nor edema. No gross deformities of extremities. Neurologic:  Normal speech and language. No gross focal neurologic deficits are appreciated.  Skin:  Skin  is warm, dry and intact. No rash noted. Psychiatric: Mood and affect are normal. Speech and behavior are normal.    RADIOLOGY I, Rosston N BROWN, personally viewed and evaluated these images (plain radiographs) as part of my medical decision making, as well as reviewing the written report by the radiologist.  Dg Chest 2 View  Result Date: 03/25/2017 CLINICAL DATA:  Sinus congestion and yellow sputum.  Former smoker. EXAM: CHEST  2 VIEW COMPARISON:  05/10/2016 FINDINGS: The heart size and mediastinal contours are within normal limits. Both lungs are clear. The visualized skeletal structures are unremarkable. IMPRESSION: No active cardiopulmonary disease. Electronically Signed   By: Burman Nieves M.D.   On: 03/25/2017 05:57     Procedures   ____________________________________________   INITIAL IMPRESSION / ASSESSMENT AND PLAN / ED COURSE  Pertinent labs & imaging results that were available during my care of the patient were reviewed by me and considered in my medical decision making (see chart for details).        ____________________________________________  FINAL CLINICAL IMPRESSION(S) / ED DIAGNOSES  Final diagnoses:  Acute non-recurrent maxillary sinusitis  Acute bronchitis, unspecified organism     MEDICATIONS GIVEN DURING THIS VISIT:  Medications  benzonatate (TESSALON) capsule 100 mg (100 mg Oral Given 03/25/17 0602)  azithromycin (ZITHROMAX) tablet 500 mg (500 mg Oral Given 03/25/17 0602)     NEW OUTPATIENT MEDICATIONS STARTED DURING THIS VISIT:  New Prescriptions   AZITHROMYCIN (ZITHROMAX) 500 MG TABLET    Take 1 tablet (500 mg total) by mouth daily. Take 1 tablet daily for 3 days.   CHLORPHENIRAMINE-HYDROCODONE (TUSSIONEX PENNKINETIC ER) 10-8 MG/5ML SUER    Take 5 mLs by mouth every 12 (twelve) hours as needed.    Modified Medications   No medications on file    Discontinued Medications   No medications on file     Note:  This document was  prepared using Dragon voice recognition software and may include unintentional dictation errors.    Darci Current, MD 03/25/17 [phone]

## 2017-03-25 NOTE — ED Triage Notes (Addendum)
Patient ambulatory to triage with steady gait, without difficulty or distress noted; pt reports since yesterday having sinus congestion and prod cough yellow sputum

## 2017-03-25 NOTE — ED Notes (Signed)
Patient transported to X-ray 

## 2017-07-12 ENCOUNTER — Encounter: Payer: Self-pay | Admitting: Emergency Medicine

## 2017-07-12 ENCOUNTER — Emergency Department
Admission: EM | Admit: 2017-07-12 | Discharge: 2017-07-12 | Disposition: A | Discharge status: Home / Self Care | Payer: BLUE CROSS/BLUE SHIELD | Attending: Emergency Medicine | Admitting: Emergency Medicine

## 2017-07-12 ENCOUNTER — Emergency Department: Payer: BLUE CROSS/BLUE SHIELD

## 2017-07-12 DIAGNOSIS — Y939 Activity, unspecified: Secondary | ICD-10-CM | POA: Diagnosis not present

## 2017-07-12 DIAGNOSIS — S0993XA Unspecified injury of face, initial encounter: Secondary | ICD-10-CM | POA: Diagnosis present

## 2017-07-12 DIAGNOSIS — F17200 Nicotine dependence, unspecified, uncomplicated: Secondary | ICD-10-CM | POA: Diagnosis not present

## 2017-07-12 DIAGNOSIS — S0083XA Contusion of other part of head, initial encounter: Secondary | ICD-10-CM | POA: Insufficient documentation

## 2017-07-12 DIAGNOSIS — Y999 Unspecified external cause status: Secondary | ICD-10-CM | POA: Insufficient documentation

## 2017-07-12 DIAGNOSIS — H1131 Conjunctival hemorrhage, right eye: Secondary | ICD-10-CM | POA: Insufficient documentation

## 2017-07-12 DIAGNOSIS — Y929 Unspecified place or not applicable: Secondary | ICD-10-CM | POA: Diagnosis not present

## 2017-07-12 DIAGNOSIS — S098XXA Other specified injuries of head, initial encounter: Secondary | ICD-10-CM | POA: Diagnosis not present

## 2017-07-12 DIAGNOSIS — I10 Essential (primary) hypertension: Secondary | ICD-10-CM | POA: Insufficient documentation

## 2017-07-12 DIAGNOSIS — Z79899 Other long term (current) drug therapy: Secondary | ICD-10-CM | POA: Diagnosis not present

## 2017-07-12 HISTORY — DX: Essential (primary) hypertension: I10

## 2017-07-12 MED ORDER — OXYCODONE-ACETAMINOPHEN 5-325 MG PO TABS
1.0000 | ORAL_TABLET | Freq: Once | ORAL | Status: AC
  Administered 2017-07-12: 1 via ORAL
  Filled 2017-07-12: qty 1

## 2017-07-12 MED ORDER — ONDANSETRON 4 MG PO TBDP: 4.0000 mg | ORAL_TABLET | Freq: Three times a day (TID) | ORAL | 0 refills | Status: AC | PRN

## 2017-07-12 MED ORDER — ONDANSETRON 4 MG PO TBDP
4.0000 mg | ORAL_TABLET | Freq: Once | ORAL | Status: DC
  Filled 2017-07-12: qty 1

## 2017-07-12 MED ORDER — IBUPROFEN 800 MG PO TABS: 800.0000 mg | ORAL_TABLET | Freq: Three times a day (TID) | ORAL | 0 refills | Status: AC | PRN

## 2017-07-12 MED ORDER — OXYCODONE-ACETAMINOPHEN 5-325 MG PO TABS: 1.0000 | ORAL_TABLET | ORAL | 0 refills | Status: AC | PRN

## 2017-07-12 MED ORDER — IBUPROFEN 800 MG PO TABS
800.0000 mg | ORAL_TABLET | Freq: Once | ORAL | Status: AC
  Administered 2017-07-12: 800 mg via ORAL
  Filled 2017-07-12: qty 1
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
## 2019-11-30 ENCOUNTER — Ambulatory Visit: Payer: BLUE CROSS/BLUE SHIELD | Attending: Internal Medicine

## 2019-11-30 ENCOUNTER — Ambulatory Visit: Payer: Self-pay

## 2019-11-30 DIAGNOSIS — Z23 Encounter for immunization: Secondary | ICD-10-CM

## 2019-11-30 NOTE — Progress Notes (Signed)
   Covid-19 Vaccination Clinic  Name:  Yvonne Daniels    MRN: [id number] DOB: December 24, 1975  11/30/2019  [name] was observed post Covid-19 immunization for 15 minutes without incident. She was provided with Vaccine Information Sheet and instruction to access the V-Safe system.   Ms. Levels was instructed to call 911 with any severe reactions post vaccine: Marland Kitchen Difficulty breathing  . Swelling of face and throat  . A fast heartbeat  . A bad rash all over body  . Dizziness and weakness   Immunizations Administered    Name Date Dose VIS Date Route   Pfizer COVID-19 Vaccine 11/30/2019  5:19 PM 0.3 mL 08/31/2018 Intramuscular   Manufacturer: [employer], [employer]   Lot: [id number]   NDC: [address]-2

## 2019-12-21 ENCOUNTER — Ambulatory Visit: Payer: BLUE CROSS/BLUE SHIELD | Attending: Internal Medicine

## 2019-12-21 DIAGNOSIS — Z23 Encounter for immunization: Secondary | ICD-10-CM

## 2019-12-21 NOTE — Progress Notes (Signed)
   Covid-19 Vaccination Clinic  Name:  Yvonne Daniels    MRN: [id number] DOB: [birth year]  12/21/2019  Yvonne Daniels was observed post Covid-19 immunization for 15 minutes without incident. She was provided with Vaccine Information Sheet and instruction to access the V-Safe system.   Yvonne Daniels was instructed to call 911 with any severe reactions post vaccine: Marland Kitchen Difficulty breathing  . Swelling of face and throat  . A fast heartbeat  . A bad rash all over body  . Dizziness and weakness   Immunizations Administered    Name Date Dose VIS Date Route   Pfizer COVID-19 Vaccine 12/21/2019  5:10 PM 0.3 mL 08/31/2018 Intramuscular   Manufacturer: [employer], [employer]   Lot: [id number]   NDC: [address]-3
